# Patient Record
Sex: Female | Born: 1995 | Race: White | Hispanic: No | Marital: Single | Smoking: Never smoker
Health system: Southern US, Community
[De-identification: ages and names within clinical notes are randomized; demographics above are authoritative.]

## PROBLEM LIST (undated history)

## (undated) DIAGNOSIS — R1032 Left lower quadrant pain: Secondary | ICD-10-CM

## (undated) DIAGNOSIS — R300 Dysuria: Secondary | ICD-10-CM

## (undated) HISTORY — DX: Dysuria: R30.0

## (undated) HISTORY — DX: Left lower quadrant pain: R10.32

---
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
## 2016-08-21 DIAGNOSIS — D696 Thrombocytopenia, unspecified: Secondary | ICD-10-CM | POA: Insufficient documentation

## 2016-08-21 NOTE — Progress Notes (Deleted)
  [facility]  Telephone:(336) [phone] Fax:(336) [phone]  ID: Monica NobleKayla Patton OB: 1995-05-13  MR#: [id number]  ION#:[id number]#:[id number]  Patient Care Team: Charlton Amorarroll, Hillary N, MD as PCP - General (Pediatrics)  CHIEF COMPLAINT: Thrombocytopenia  INTERVAL HISTORY: ***  REVIEW OF SYSTEMS:   ROS  As per HPI. Otherwise, a complete review of systems is negative.  PAST MEDICAL HISTORY: Past Medical History:  Diagnosis Date  . Dysuria   . LLQ abdominal pain     PAST SURGICAL HISTORY: No past surgical history on file.  FAMILY HISTORY: No family history on file.  ADVANCED DIRECTIVES (Y/N):  N  HEALTH MAINTENANCE: Social History  Substance Use Topics  . Smoking status: Not on file  . Smokeless tobacco: Not on file  . Alcohol use Not on file     Colonoscopy:  PAP:  Bone density:  Lipid panel:  Allergies no known allergies  No current outpatient prescriptions on file.   No current facility-administered medications for this visit.     OBJECTIVE: There were no vitals filed for this visit.   There is no height or weight on file to calculate BMI.    ECOG FS:{CHL ONC [phone]}  General: Well-developed, well-nourished, no acute distress. Eyes: Pink conjunctiva, anicteric sclera. HEENT: Normocephalic, moist mucous membranes, clear oropharnyx. Lungs: Clear to auscultation bilaterally. Heart: Regular rate and rhythm. No rubs, murmurs, or gallops. Abdomen: Soft, nontender, nondistended. No organomegaly noted, normoactive bowel sounds. Musculoskeletal: No edema, cyanosis, or clubbing. Neuro: Alert, answering all questions appropriately. Cranial nerves grossly intact. Skin: No rashes or petechiae noted. Psych: Normal affect. Lymphatics: No cervical, calvicular, axillary or inguinal LAD.   LAB RESULTS:  No results found for: NA, K, CL, CO2, GLUCOSE, BUN, CREATININE, CALCIUM, PROT, ALBUMIN, AST, ALT, ALKPHOS, BILITOT, GFRNONAA, GFRAA  No results found for:  WBC, NEUTROABS, HGB, HCT, MCV, PLT   STUDIES: No results found.  ASSESSMENT: Thrombocytopenia  PLAN:    1. Thrombocytopenia:  Patient expressed understanding and was in agreement with this plan. She also understands that She can call clinic at any time with any questions, concerns, or complaints.   Cancer Staging No matching staging information was found for the patient.  Monica Ruthsimothy J Maedell Hedger, MD   08/21/2016 9:45 PM

## 2016-08-22 ENCOUNTER — Inpatient Hospital Stay: Payer: BLUE CROSS/BLUE SHIELD | Admitting: Oncology

## 2019-10-28 DIAGNOSIS — O09899 Supervision of other high risk pregnancies, unspecified trimester: Secondary | ICD-10-CM | POA: Insufficient documentation

## 2019-10-28 DIAGNOSIS — F419 Anxiety disorder, unspecified: Secondary | ICD-10-CM | POA: Insufficient documentation

## 2019-11-04 DIAGNOSIS — O0993 Supervision of high risk pregnancy, unspecified, third trimester: Secondary | ICD-10-CM | POA: Insufficient documentation

## 2020-02-09 ENCOUNTER — Observation Stay: Payer: Medicaid Other

## 2020-02-09 ENCOUNTER — Other Ambulatory Visit: Payer: Self-pay

## 2020-02-09 ENCOUNTER — Observation Stay
Admission: EM | Admit: 2020-02-09 | Discharge: 2020-02-10 | Disposition: A | Discharge status: Home / Self Care | Payer: Medicaid Other

## 2020-02-09 DIAGNOSIS — Z3A33 33 weeks gestation of pregnancy: Secondary | ICD-10-CM | POA: Diagnosis not present

## 2020-02-09 DIAGNOSIS — W108XXA Fall (on) (from) other stairs and steps, initial encounter: Secondary | ICD-10-CM | POA: Diagnosis not present

## 2020-02-09 DIAGNOSIS — R109 Unspecified abdominal pain: Secondary | ICD-10-CM

## 2020-02-09 DIAGNOSIS — O26893 Other specified pregnancy related conditions, third trimester: Secondary | ICD-10-CM | POA: Diagnosis present

## 2020-02-09 DIAGNOSIS — O99119 Other diseases of the blood and blood-forming organs and certain disorders involving the immune mechanism complicating pregnancy, unspecified trimester: Secondary | ICD-10-CM

## 2020-02-09 DIAGNOSIS — O26899 Other specified pregnancy related conditions, unspecified trimester: Secondary | ICD-10-CM

## 2020-02-09 DIAGNOSIS — D693 Immune thrombocytopenic purpura: Secondary | ICD-10-CM | POA: Diagnosis present

## 2020-02-09 DIAGNOSIS — O9A213 Injury, poisoning and certain other consequences of external causes complicating pregnancy, third trimester: Principal | ICD-10-CM | POA: Diagnosis present

## 2020-02-09 DIAGNOSIS — D696 Thrombocytopenia, unspecified: Secondary | ICD-10-CM

## 2020-02-09 HISTORY — DX: Other diseases of the blood and blood-forming organs and certain disorders involving the immune mechanism complicating pregnancy, unspecified trimester: O99.119

## 2020-02-09 HISTORY — DX: Thrombocytopenia, unspecified: D69.6

## 2020-02-09 LAB — PROTIME-INR
INR: 1 (ref 0.8–1.2)
Prothrombin Time: 12.5 seconds (ref 11.4–15.2)

## 2020-02-09 LAB — KLEIHAUER-BETKE STAIN
Fetal Cells %: 0 %
Quantitation Fetal Hemoglobin: 0 mL

## 2020-02-09 LAB — CBC
HCT: 30.2 % — ABNORMAL LOW (ref 36.0–46.0)
Hemoglobin: 9.9 g/dL — ABNORMAL LOW (ref 12.0–15.0)
MCH: 27.3 pg (ref 26.0–34.0)
MCHC: 32.8 g/dL (ref 30.0–36.0)
MCV: 83.4 fL (ref 80.0–100.0)
Platelets: 62 10*3/uL — ABNORMAL LOW (ref 150–400)
RBC: 3.62 MIL/uL — ABNORMAL LOW (ref 3.87–5.11)
RDW: 14.5 % (ref 11.5–15.5)
WBC: 10.6 10*3/uL — ABNORMAL HIGH (ref 4.0–10.5)
nRBC: 0 % (ref 0.0–0.2)

## 2020-02-09 LAB — TYPE AND SCREEN
ABO/RH(D): A POS
Antibody Screen: NEGATIVE

## 2020-02-09 LAB — FIBRINOGEN: Fibrinogen: 494 mg/dL — ABNORMAL HIGH (ref 210–475)

## 2020-02-09 LAB — APTT: aPTT: 26 seconds (ref 24–36)

## 2020-02-09 LAB — ABO/RH: ABO/RH(D): A POS

## 2020-02-09 MED ORDER — CALCIUM CARBONATE ANTACID 500 MG PO CHEW: 2.0000 | CHEWABLE_TABLET | ORAL | Status: DC | PRN

## 2020-02-09 MED ORDER — ZOLPIDEM TARTRATE 5 MG PO TABS: 5.0000 mg | ORAL_TABLET | Freq: Every evening | ORAL | Status: DC | PRN

## 2020-02-09 MED ORDER — OXYTOCIN-SODIUM CHLORIDE 30-0.9 UT/500ML-% IV SOLN
INTRAVENOUS | Status: AC
  Filled 2020-02-09: qty 500

## 2020-02-09 MED ORDER — DOCUSATE SODIUM 100 MG PO CAPS: 100.0000 mg | ORAL_CAPSULE | Freq: Every day | ORAL | Status: DC

## 2020-02-09 MED ORDER — PRENATAL MULTIVITAMIN CH: 1.0000 | ORAL_TABLET | Freq: Every day | ORAL | Status: DC

## 2020-02-09 MED ORDER — ACETAMINOPHEN 325 MG PO TABS
650.0000 mg | ORAL_TABLET | ORAL | Status: DC | PRN
  Administered 2020-02-10: 650 mg via ORAL
  Filled 2020-02-09: qty 2

## 2020-02-09 NOTE — Progress Notes (Addendum)
Monica Patton is a 24 y.o. female. She is at [redacted]w[redacted]d.  Patient's last menstrual period was 06/17/2019. Estimated Date of Delivery: 03/23/20  Prenatal care site: [facility] OBGYN with transfer to HROB at [facility] MFM at 32wks  Current pregnancy complicated by:  1. Anxiety - EPDS 16 at new OB visit - Started on Zoloft 50mg  PO daily - 11/08/19 Never started the Zoloft 2.  ITP since 2016,  s/p MFM and Hematology - transfer to [facility] HROB at 32wks. - 01/24/20: Platelets 53k  3. Anemia - 01/24/20:  Hgb 9.8  4. Varicella Equivocal - Will need immunization after delivery  Chief complaint: s/p fall on abdomen at 1920 this eve, tried to catch herself with hands, but did hit her left side of abdomen.  - No LOF or VB noted.  - Feels mild menstrual like cramping.  - No tightness or upper abdominal pain.  - last prenatal visit was 10/22 at [facility].   S: Resting comfortably. no VB.no LOF,  Active fetal movement. Denies: HA, visual changes, SOB, or RUQ/epigastric pain  Maternal Medical History:   Past Medical History:  Diagnosis Date  . Dysuria   . LLQ abdominal pain   . Thrombocytopenia affecting pregnancy (HCC) 02/09/2020    History reviewed. No pertinent surgical history.  No Known Allergies  Prior to Admission medications   Not on File    Social History: She  reports that she has never smoked. She has never used smokeless tobacco. She reports previous alcohol use. She reports previous drug use.  Family History:  no history of gyn cancers  Review of Systems: A full review of systems was performed and negative except as noted in the HPI.     O:  BP 104/64   Pulse 89   Temp 98.3 F (36.8 C) (Oral)   Resp 18   Ht 5\' 2"  (1.575 m)   Wt 56.7 kg   LMP 06/17/2019   BMI 22.86 kg/m  Results for orders placed or performed during the hospital encounter of 02/09/20 (from the past 48 hour(s))  Type and screen [facility]   Collection Time: 02/09/20  9:02 PM  Result  Value Ref Range   ABO/RH(D) PENDING    Antibody Screen PENDING    Sample Expiration      02/12/2020,2359 Performed at [facility], [address] [place], [address] [place]      Constitutional: NAD, AAOx3  CV: RRR PULM: nl respiratory effort     Abd: gravid, non-tender, non-distended, soft   Ext: Non-tender, Nonedematous   Psych: mood appropriate, speech normal Pelvic: deferred  Fetal  monitoring: Cat I Appropriate for GA Baseline: 125bpm Variability: moderate Accelerations: present x >2 Decelerations absent  TOCO: Occasional mild contractions with uterine irritability intermittently    A/P: 24 y.o. g1p0 at [redacted]w[redacted]d here for antenatal surveillance for abdominal pain, s/p fall.   Principle Diagnosis:  High risk pregnancy in third trimester, thrombocytopenia, s/p fall, [redacted]w[redacted]d   Fetal Wellbeing: Reassuring Cat 1 tracing with reactive NST   Continuous EFM and Toco  Occasional contractions noted with uterine irritability - Tata denies feeling regular contractions but does report mild cramping   KB, CBC, PT, PTT, and fibrinogen Pending   OB [redacted]w[redacted]d reassuring, no evidence of abruption  Discussed plan of care with Dr. [redacted]w[redacted]d.  Will monitor overnight d/t reported mild cramping and mild contractions.   Tylenol ordered for pain management    [place], CNM 02/09/2020  9:51 PM

## 2020-02-09 NOTE — OB Triage Note (Signed)
Pt is a G1P0 and [redacted]w[redacted]d presenting to L&D stating she fell going down porch steps at [employer]. Pt reports falling directly on her abdomen and slightly tilted to the left. Pt states she tried to catch herself with her hands but could not move fast enough. Pt reports positive fetal movement since the fall and declines VB and ctx. VSS. Monitors applied and assessing.

## 2020-02-09 NOTE — Progress Notes (Signed)
Ultrasound at bedside

## 2020-02-09 NOTE — Progress Notes (Signed)
Monica Patton is a 24 y.o. female. She is at [redacted]w[redacted]d.  Patient's last menstrual period was 06/17/2019. Estimated Date of Delivery: 03/23/20  Prenatal care site: [facility] OBGYN with transfer to HROB at [facility] MFM at 32wks  Current pregnancy complicated by:  1. Anxiety - EPDS 16 at new OB visit - Started on Zoloft 50mg  PO daily - 11/08/19 Never started the Zoloft 2.  ITP since 2016,  s/p MFM and Hematology - transfer to [facility] HROB at 32wks. - 01/24/20: Platelets 53k  3. Anemia - 01/24/20:  Hgb 9.8  4. Varicella Equivocal - Will need immunization after delivery  Chief complaint: s/p fall on abdomen at 1920 this eve, tried to catch herself with hands, but did hit her left side of abdomen.  - No LOF or VB noted.  - Feels mild menstrual like cramping.  - No tightness or upper abdominal pain.  - last prenatal visit was 10/22 at [facility].   S: Resting comfortably. no VB.no LOF,  Active fetal movement. Denies: HA, visual changes, SOB, or RUQ/epigastric pain  Maternal Medical History:   Past Medical History:  Diagnosis Date  . Dysuria   . LLQ abdominal pain   . Thrombocytopenia affecting pregnancy (HCC) 02/09/2020    History reviewed. No pertinent surgical history.  No Known Allergies  Prior to Admission medications   Not on File      Social History: She  reports that she has never smoked. She has never used smokeless tobacco. She reports previous alcohol use. She reports previous drug use.  Family History:  no history of gyn cancers  Review of Systems: A full review of systems was performed and negative except as noted in the HPI.     O:  Ht 5\' 2"  (1.575 m)   Wt 56.7 kg   LMP 06/17/2019   BMI 22.86 kg/m  No results found for this or any previous visit (from the past 48 hour(s)).   Constitutional: NAD, AAOx3  HE/ENT: extraocular movements grossly intact, moist mucous membranes CV: RRR PULM: nl respiratory effort, CTABL     Abd: gravid, non-tender, non-distended,  soft      Ext: Non-tender, Nonedematous   Psych: mood appropriate, speech normal Pelvic: deferred  Fetal  monitoring: Cat I Appropriate for GA Baseline: 125bpm Variability: moderate Accelerations: present x >2 Decelerations absent Time  TOCO: UI with 2 UCs noted in 08/17/2019.     A/P: 24 y.o. g1p0 at [redacted]w[redacted]d here for antenatal surveillance for abdominal pain, s/p fall.   Principle Diagnosis:  High risk pregnancy in third trimester, thrombocytopenia, s/p fall, [redacted]w[redacted]d   Fetal Wellbeing: Reassuring Cat 1 tracing with reactive NST   Continuous EFM and Toco  KB, CBC, PT, PTT, and fibrinogen ordered.   OB [redacted]w[redacted]d ordered to assess placenta, fetal position.     [redacted]w[redacted]d, CNM 02/09/2020  8:03 PM

## 2020-02-09 NOTE — H&P (Signed)
Monica Patton is a 24 y.o. female. She is at [redacted]w[redacted]d.  Patient's last menstrual period was 06/17/2019. Estimated Date of Delivery: 03/23/20  Prenatal care site: [facility] OBGYN with transfer to HROB at [facility] MFM at 32wks  Current pregnancy complicated by:  1. Anxiety - EPDS 16 at new OB visit - Started on Zoloft 50mg  PO daily - 11/08/19 Never started the Zoloft 2.  ITP since 2016,  s/p MFM and Hematology - transfer to [facility] HROB at 32wks. - 01/24/20: Platelets 53k  3. Anemia - 01/24/20:  Hgb 9.8  4. Varicella Equivocal - Will need immunization after delivery  Chief complaint: s/p fall on abdomen at 1920 this eve, tried to catch herself with hands, but did hit her left side of abdomen.  - No LOF or VB noted.  - Feels mild menstrual like cramping.  - No tightness or upper abdominal pain.  - last prenatal visit was 10/22 at [facility].   S: Resting comfortably. no VB.no LOF,  Active fetal movement. Denies: HA, visual changes, SOB, or RUQ/epigastric pain  Maternal Medical History:   Past Medical History:  Diagnosis Date  . Dysuria   . LLQ abdominal pain   . Thrombocytopenia affecting pregnancy (HCC) 02/09/2020    History reviewed. No pertinent surgical history.  No Known Allergies  Prior to Admission medications   Not on File    Social History: She  reports that she has never smoked. She has never used smokeless tobacco. She reports previous alcohol use. She reports previous drug use.  Family History:  no history of gyn cancers  Review of Systems: A full review of systems was performed and negative except as noted in the HPI.     O:  BP 104/64   Pulse 89   Temp 98.3 F (36.8 C) (Oral)   Resp 18   Ht 5\' 2"  (1.575 m)   Wt 56.7 kg   LMP 06/17/2019   BMI 22.86 kg/m  Results for orders placed or performed during the hospital encounter of 02/09/20 (from the past 48 hour(s))  Type and screen [facility]   Collection Time: 02/09/20  9:02 PM  Result  Value Ref Range   ABO/RH(D) PENDING    Antibody Screen PENDING    Sample Expiration      02/12/2020,2359 Performed at [facility], [address] [place], [address] [place]      Constitutional: NAD, AAOx3  CV: RRR PULM: nl respiratory effort     Abd: gravid, non-tender, non-distended, soft   Ext: Non-tender, Nonedematous   Psych: mood appropriate, speech normal Pelvic: deferred  Fetal  monitoring: Cat I Appropriate for GA Baseline: 125bpm Variability: moderate Accelerations: present x >2 Decelerations absent  TOCO: Occasional mild contractions with uterine irritability intermittently    A/P: 24 y.o. g1p0 at [redacted]w[redacted]d here for antenatal surveillance for abdominal pain, s/p fall.   Principle Diagnosis:  High risk pregnancy in third trimester, thrombocytopenia, s/p fall, [redacted]w[redacted]d   Fetal Wellbeing: Reassuring Cat 1 tracing with reactive NST   Continuous EFM and Toco  Occasional contractions noted with uterine irritability - [name] denies feeling regular contractions but does report mild cramping   CBC and KB pending   OB [redacted]w[redacted]d reassuring, no evidence of abruption  Discussed plan of care with Dr. [redacted]w[redacted]d.  Will monitor overnight d/t reported mild cramping and mild contractions.   Tylenol ordered for pain management    [place], CNM Certified Nurse Midwife [facility]

## 2020-02-10 DIAGNOSIS — O9A213 Injury, poisoning and certain other consequences of external causes complicating pregnancy, third trimester: Secondary | ICD-10-CM | POA: Diagnosis not present

## 2020-02-10 NOTE — Discharge Summary (Signed)
Monica Patton is a 24 y.o. female. She is at [redacted]w[redacted]d.  Patient's last menstrual period was 06/17/2019. Estimated Date of Delivery: 03/23/20  Prenatal care site: [facility] OBGYN with transfer to HROB at [facility] MFM at 32wks  Current pregnancy complicated by:  1. Anxiety - EPDS 16 at new OB visit - Started on Zoloft 50mg  PO daily - 11/08/19 Never started the Zoloft 2.  ITP since 2016,  s/p MFM and Hematology - transfer to [facility] HROB at 32wks. - 01/24/20: Platelets 53k  3. Anemia - 01/24/20:  Hgb 9.8  4. Varicella Equivocal - Will need immunization after delivery  Chief complaint: s/p fall on abdomen at 1920 on 10/31 tried to catch herself with hands, but did hit her left side of abdomen.  - No LOF or VB noted.  - Feels mild menstrual like cramping.  - No tightness or upper abdominal pain.  - last prenatal visit was 10/22 at [facility].   S: Resting comfortably. no VB.no LOF,  Active fetal movement. Denies: HA, visual changes, SOB, or RUQ/epigastric pain   Maternal Medical History:   Past Medical History:  Diagnosis Date  . Dysuria   . LLQ abdominal pain   . Thrombocytopenia affecting pregnancy (HCC) 02/09/2020    History reviewed. No pertinent surgical history.  No Known Allergies  Prior to Admission medications   Not on File      Social History: She  reports that she has never smoked. She has never used smokeless tobacco. She reports previous alcohol use. She reports previous drug use.  Family History:  no history of gyn cancers  Review of Systems: A full review of systems was performed and negative except as noted in the HPI.     O:  BP 113/67   Pulse 98   Temp 97.9 F (36.6 C) (Axillary)   Resp 17   Ht 5\' 2"  (1.575 m)   Wt 56.7 kg   LMP 06/17/2019   BMI 22.86 kg/m  Results for orders placed or performed during the hospital encounter of 02/09/20 (from the past 48 hour(s))  Kleihauer-Betke stain   Collection Time: 02/09/20  9:02 PM  Result Value Ref Range    Fetal Cells % 0 %   Quantitation Fetal Hemoglobin 0.0000 mL   # Vials RhIg NOT INDICATED   APTT   Collection Time: 02/09/20  9:02 PM  Result Value Ref Range   aPTT 26 24 - 36 seconds  Protime-INR   Collection Time: 02/09/20  9:02 PM  Result Value Ref Range   Prothrombin Time 12.5 11.4 - 15.2 seconds   INR 1.0 0.8 - 1.2  Fibrinogen   Collection Time: 02/09/20  9:02 PM  Result Value Ref Range   Fibrinogen 494 (H) 210 - 475 mg/dL  CBC on admission   Collection Time: 02/09/20  9:02 PM  Result Value Ref Range   WBC 10.6 (H) 4.0 - 10.5 K/uL   RBC 3.62 (L) 3.87 - 5.11 MIL/uL   Hemoglobin 9.9 (L) 12.0 - 15.0 g/dL   HCT 02/11/20 (L) 36 - 46 %   MCV 83.4 80.0 - 100.0 fL   MCH 27.3 26.0 - 34.0 pg   MCHC 32.8 30.0 - 36.0 g/dL   RDW 02/11/20 74.1 - 28.7 %   Platelets 62 (L) 150 - 400 K/uL   nRBC 0.0 0.0 - 0.2 %  Type and screen [facility]   Collection Time: 02/09/20  9:02 PM  Result Value Ref Range   ABO/RH(D)  A POS    Antibody Screen NEG    Sample Expiration      02/12/2020,2359 Performed at [facility], [address] [place] [place], [place] [address]   ABO/Rh   Collection Time: 02/09/20  9:57 PM  Result Value Ref Range   ABO/RH(D)      A POS Performed at [facility], [address] [place], [place] [address]      Constitutional: NAD, AAOx3  HE/ENT: extraocular movements grossly intact, moist mucous membranes CV: RRR PULM: nl respiratory effort, CTABL     Abd: gravid, non-tender, non-distended, soft      Ext: Non-tender, Nonedematous   Psych: mood appropriate, speech normal Pelvic: deferred  Fetal  monitoring: Cat I Appropriate for GA Baseline: 135bpm Variability: moderate Accelerations: present  Decelerations absent   TOCO: mild UI with occasional UC     A/P: 24 y.o. g1p0 at [redacted]w[redacted]d here for antenatal surveillance for abdominal pain, s/p fall.   Principle Diagnosis:  High risk pregnancy in third trimester, thrombocytopenia, s/p  fall, [redacted]w[redacted]d   Fetal Wellbeing: Reassuring Cat 1 tracing with occasional UC and UI.   Discussed with Dr Dalbert Garnet, pt stable for DC home.   F/u as scheduled on 02/13/20 at [facility].   KB, CBC, PT, PTT, and fibrinogen WNL as above, all WNL.   OB US reassuring.   Randa Ngo, CNM 02/10/2020  10:42 AM

## 2020-02-10 NOTE — Progress Notes (Signed)
CNM notified about uterine activity and asked by RN to review strip. Pt emptied her bladder and states "I didn't realize how much I was cramping until I stood up." Pt states she feels the cramping more on the lower left quadrant of her abdomen, which is the side she fell on. BP 95/61 and HR 105. Monitors applied and assessing.

## 2021-12-06 IMAGING — US US OB COMP +14 WK
1 series · 14 of 28 positions shown · non-contrast
Comparison: none

CLINICAL DATA: Fall on abdomen.

EXAM:
LIMITED OBSTETRIC ULTRASOUND

[Series 1: us ob comp + 14 wk · 14 of 76 slices shown]
[im 3/76]
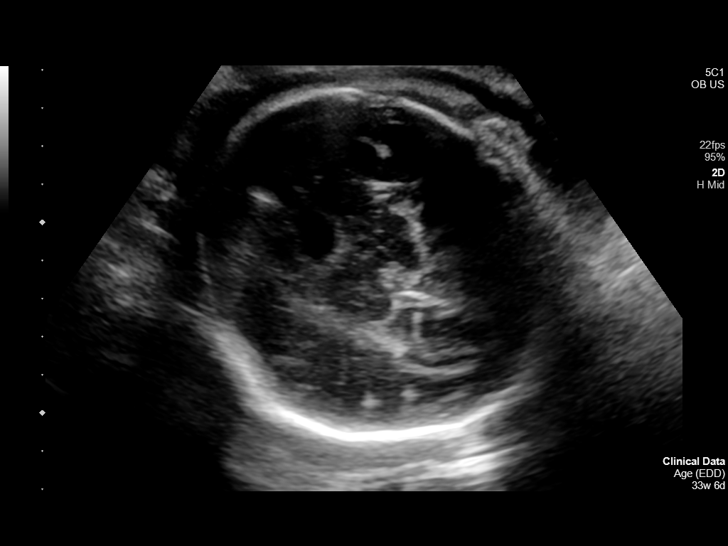
[im 9/76]
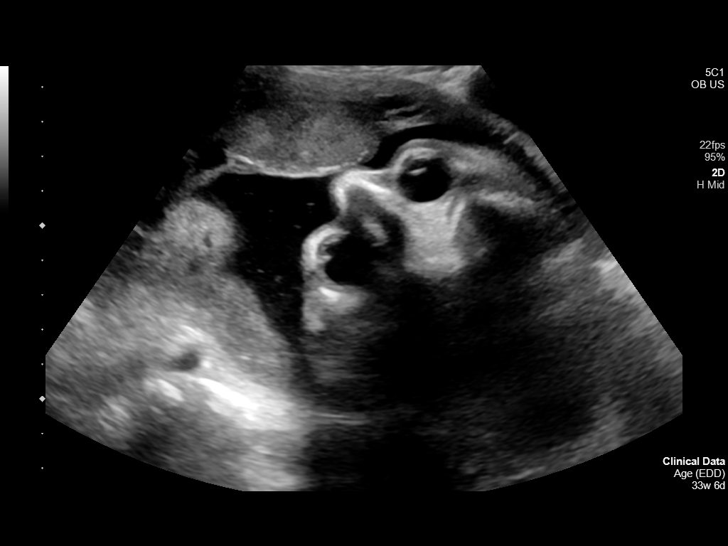
[im 14/76]
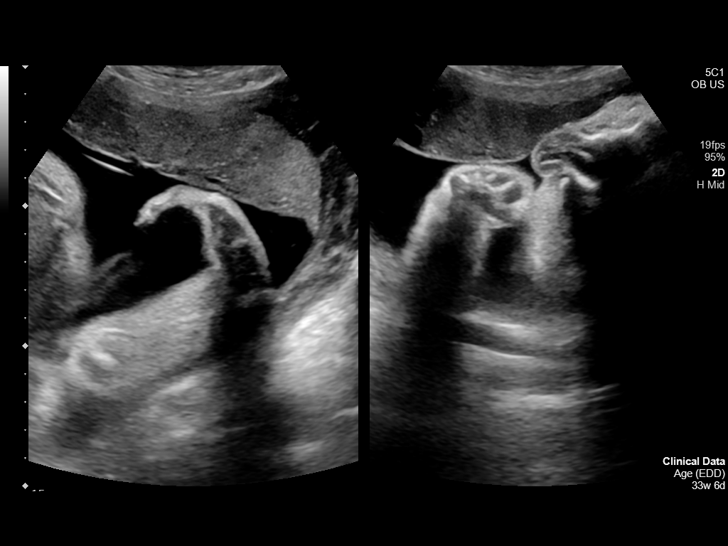
[im 20/76]
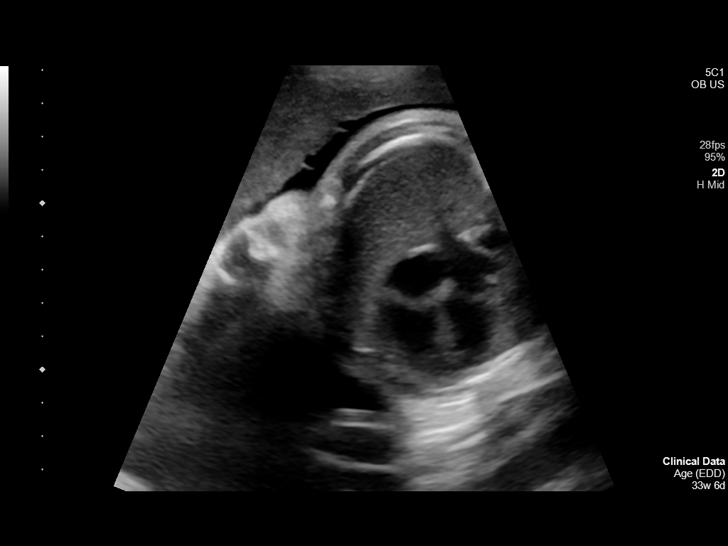
[im 26/76]
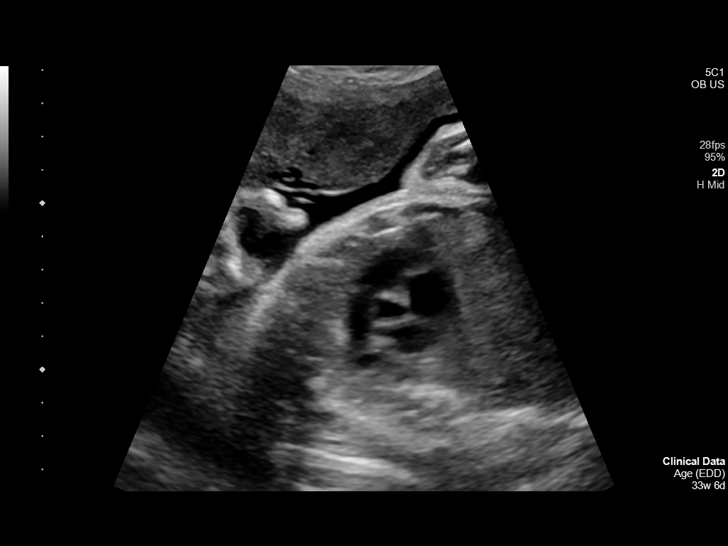
[im 31/76]
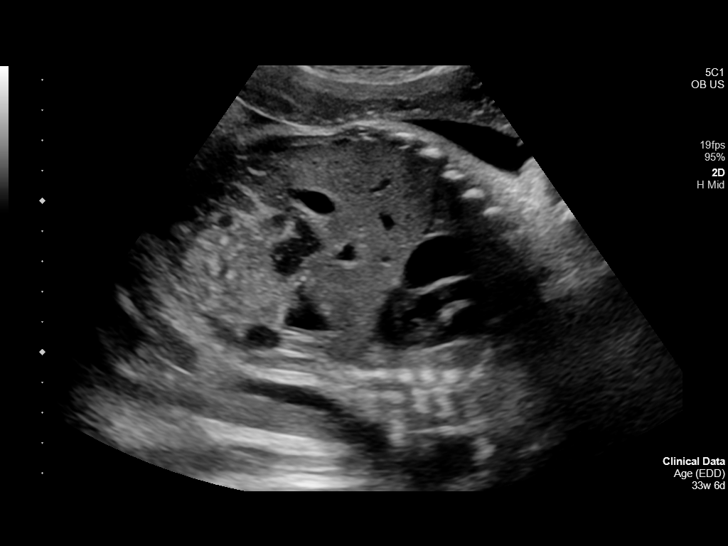
[im 37/76]
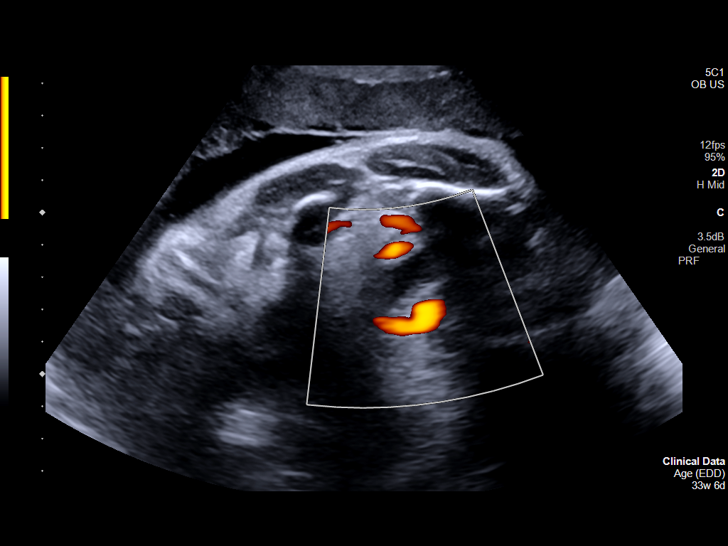
[im 42/76]
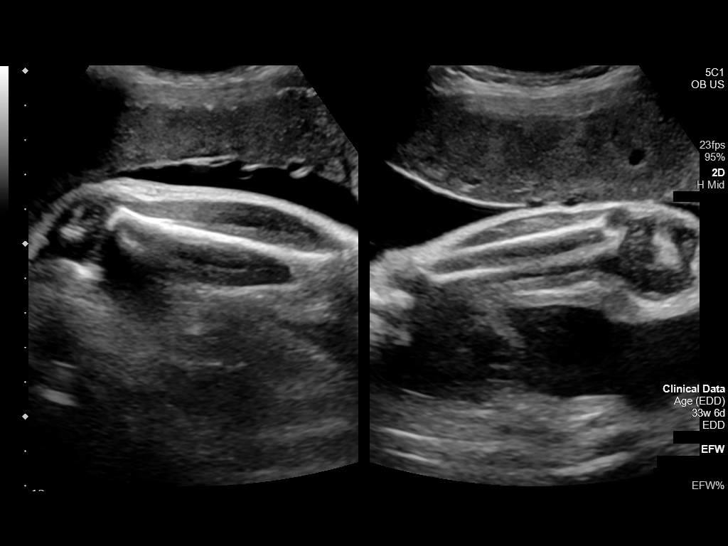
[im 48/76]
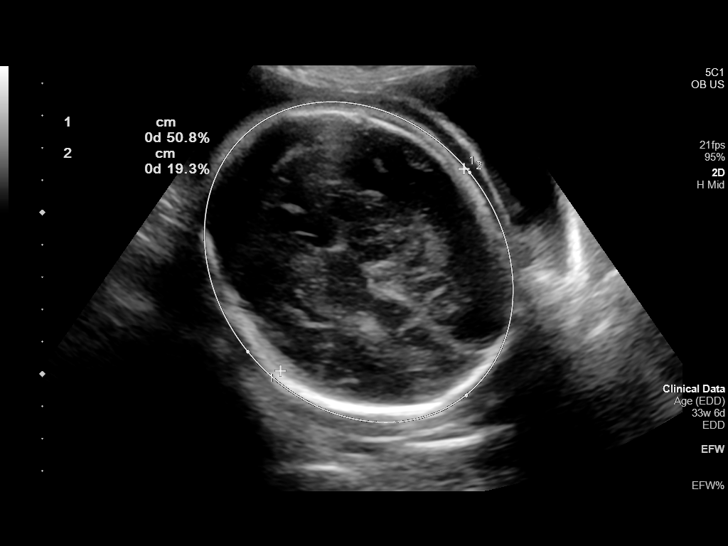
[im 53/76]
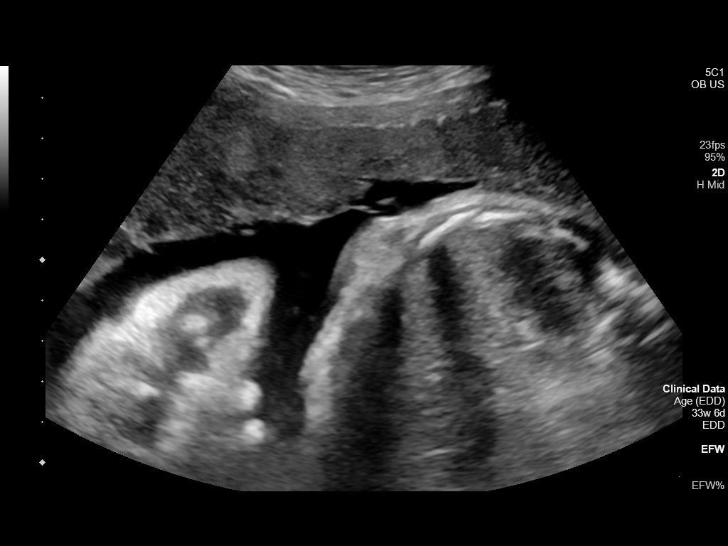
[im 59/76]
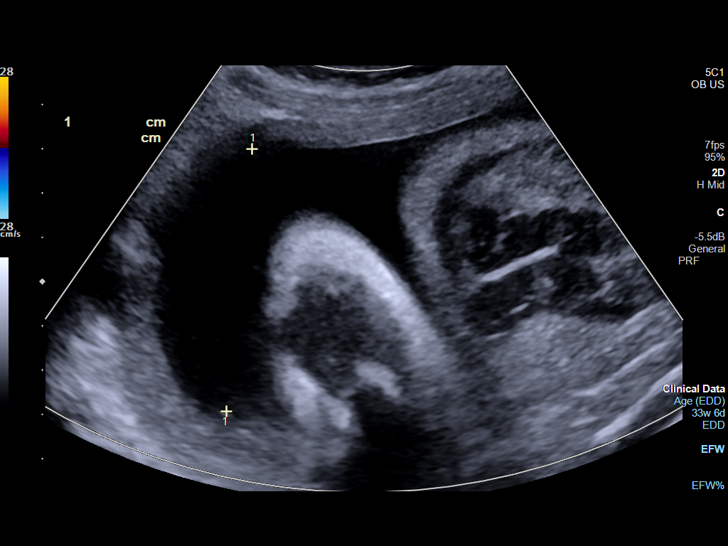
[im 64/76]
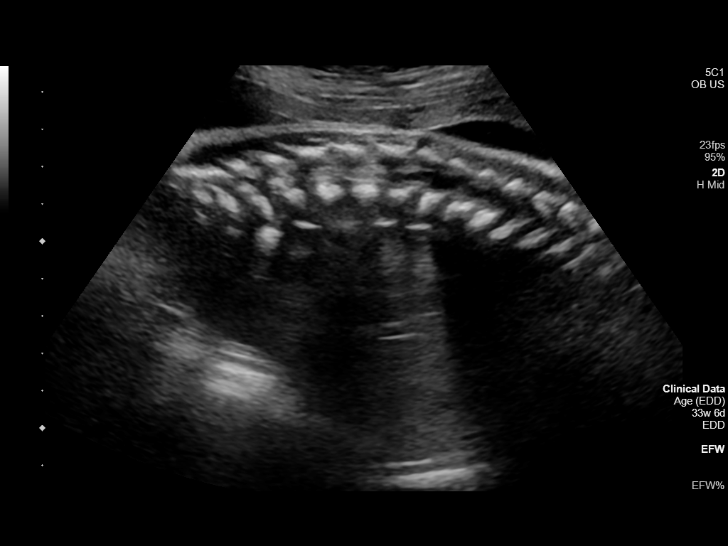
[im 70/76]
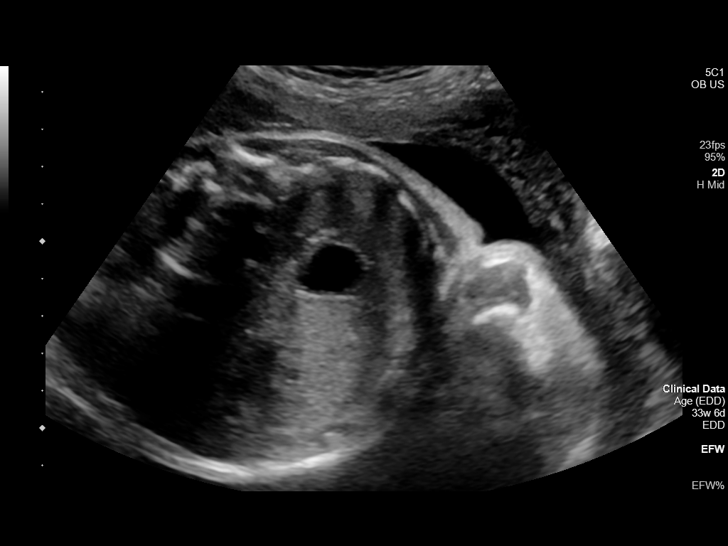
[im 76/76]
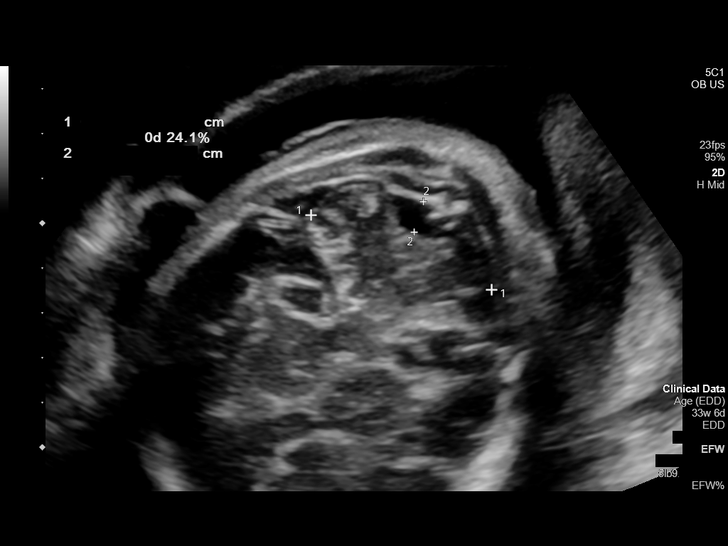

[14 of 28 positions shown; findings below may reference images not displayed]

FINDINGS: Number of Fetuses: 1

Heart Rate:  145 bpm

Movement: Visualized

Presentation: Cephalic

Placental Location: Anterior

Previa: No

Amniotic Fluid (Subjective):  Within normal limits.

AFI: 13.7 cm

BPD: 8.4 cm 34 w  0 d

MATERNAL FINDINGS:

Cervix:  Appears closed.

Uterus/Adnexae: No abnormality visualized.
IMPRESSION: Approximately 34 week pregnancy. Fetal heart rate 145 beats per
minute. No acute maternal findings.
